# Patient Record
Sex: Male | Born: 1995 | Race: White | Hispanic: No | Marital: Single
Health system: Southern US, Community
[De-identification: ages and names within clinical notes are randomized; demographics above are authoritative.]

## PROBLEM LIST (undated history)

## (undated) DIAGNOSIS — J45909 Unspecified asthma, uncomplicated: Secondary | ICD-10-CM

---
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
## 2018-10-09 ENCOUNTER — Emergency Department
Admission: EM | Admit: 2018-10-09 | Discharge: 2018-10-09 | Disposition: A | Discharge status: Home / Self Care | Payer: Commercial Managed Care - PPO | Attending: Emergency Medicine | Admitting: Emergency Medicine

## 2018-10-09 ENCOUNTER — Other Ambulatory Visit: Payer: Self-pay

## 2018-10-09 DIAGNOSIS — Y939 Activity, unspecified: Secondary | ICD-10-CM | POA: Diagnosis not present

## 2018-10-09 DIAGNOSIS — B349 Viral infection, unspecified: Secondary | ICD-10-CM | POA: Insufficient documentation

## 2018-10-09 DIAGNOSIS — Y999 Unspecified external cause status: Secondary | ICD-10-CM | POA: Insufficient documentation

## 2018-10-09 DIAGNOSIS — S098XXA Other specified injuries of head, initial encounter: Secondary | ICD-10-CM | POA: Diagnosis not present

## 2018-10-09 DIAGNOSIS — W19XXXA Unspecified fall, initial encounter: Secondary | ICD-10-CM | POA: Insufficient documentation

## 2018-10-09 DIAGNOSIS — S0990XA Unspecified injury of head, initial encounter: Secondary | ICD-10-CM

## 2018-10-09 DIAGNOSIS — Y929 Unspecified place or not applicable: Secondary | ICD-10-CM | POA: Insufficient documentation

## 2018-10-09 LAB — BASIC METABOLIC PANEL
Anion gap: 9 (ref 5–15)
BUN: 16 mg/dL (ref 6–20)
CALCIUM: 8.6 mg/dL — AB (ref 8.9–10.3)
CO2: 24 mmol/L (ref 22–32)
Chloride: 101 mmol/L (ref 98–111)
Creatinine, Ser: 1.05 mg/dL (ref 0.61–1.24)
GFR calc Af Amer: >60 mL/min (ref >60)
GFR calc non Af Amer: >60 mL/min (ref >60)
Glucose, Bld: 142 mg/dL — ABNORMAL HIGH (ref 70–99)
Potassium: 4.1 mmol/L (ref 3.5–5.1)
SODIUM: 134 mmol/L — AB (ref 135–145)

## 2018-10-09 LAB — CBC
HCT: 49.7 % (ref 39.0–52.0)
Hemoglobin: 16.3 g/dL (ref 13.0–17.0)
MCH: 31.3 pg (ref 26.0–34.0)
MCHC: 32.8 g/dL (ref 30.0–36.0)
MCV: 95.6 fL (ref 80.0–100.0)
Platelets: 179 10*3/uL (ref 150–400)
RBC: 5.2 MIL/uL (ref 4.22–5.81)
RDW: 12.2 % (ref 11.5–15.5)
WBC: 7 10*3/uL (ref 4.0–10.5)
nRBC: 0 % (ref 0.0–0.2)

## 2018-10-09 NOTE — Discharge Instructions (Addendum)
Please seek medical attention for any high fevers, chest pain, shortness of breath, change in behavior, persistent vomiting, bloody stool or any other new or concerning symptoms.  

## 2018-10-09 NOTE — ED Triage Notes (Addendum)
Pt states fever that started couple of days ago. Pt states he thinks he may have the Flu.  Pt states he became disoriented and collapsed on the floor last night and hit his head.  Pt unsure if he LOC. Pt concerned since he hit his head. Pt unsure of what happened.  Pt states pain in head. Pt is alert and oriented X4

## 2018-10-09 NOTE — ED Triage Notes (Signed)
First Nurse NOte:  C/O fever and body aches x 2 days.  Last medicated for fever yesterday.  Patient states last night when he got up out of bed, felt disoriented and hit head on the corner of a wall.  Possible episode of passing out after injury.    Patient is AAOx3.  Skin warm and dry.  NAD

## 2018-10-09 NOTE — ED Provider Notes (Signed)
[facility] Emergency Department Provider Note   ____________________________________________   I have reviewed the triage vital signs and the nursing notes.   HISTORY  Chief Complaint Concern for head injury  History limited by: Not Limited   HPI Albert Weiss is a 22 y.o. male who presents to the emergency department today because of concern for head injury. The patient states that for the last few days he has been having flu like symptoms. Last night when he got up to get something to drink he felt like he got off balance and fell hitting his head. He thinks that he might have lost consciousness at some point. Since then he has had some on and off pain over his right eye. The patient has not had any nausea or vomiting today. No change in vision.   History reviewed. No pertinent past medical history.  There are no active problems to display for this patient.   History reviewed. No pertinent surgical history.  Prior to Admission medications   Not on File    Allergies Patient has no allergy information on record.  No family history on file.  Social History Social History   Tobacco Use  . Smoking status: Not on file  Substance Use Topics  . Alcohol use: Not on file  . Drug use: Not on file    Review of Systems Constitutional: Positive for fevers. Eyes: No visual changes. ENT: No sore throat. Cardiovascular: Denies chest pain. Respiratory: Denies shortness of breath. Gastrointestinal: No abdominal pain.  No nausea, no vomiting. Genitourinary: Negative for dysuria. Musculoskeletal: Negative for back pain. Skin: Positive for laceration to left eyebrow Neurological: Negative for headache.  ____________________________________________   PHYSICAL EXAM:  VITAL SIGNS: ED Triage Vitals  Enc Vitals Group     BP 10/09/18 1340 109/64     Pulse Rate 10/09/18 1340 100     Resp 10/09/18 1340 16     Temp 10/09/18 1340 99.2 F (37.3 C)      Temp Source 10/09/18 1340 Oral     SpO2 10/09/18 1340 100 %     Weight 10/09/18 1355 115 lb (52.2 kg)     Height 10/09/18 1355 5\' 4"  (1.626 m)     Head Circumference --      Peak Flow --      Pain Score 10/09/18 1355 5   Constitutional: Alert and oriented.  Eyes: Conjunctivae are normal.  ENT      Head: Normocephalic. Small laceration to right eyebrow.      Nose: No congestion/rhinnorhea.      Mouth/Throat: Mucous membranes are moist.      Neck: No stridor. Hematological/Lymphatic/Immunilogical: No cervical lymphadenopathy. Cardiovascular: Normal rate, regular rhythm.  No murmurs, rubs, or gallops.  Respiratory: Normal respiratory effort without tachypnea nor retractions. Breath sounds are clear and equal bilaterally. No wheezes/rales/rhonchi. Gastrointestinal: Soft and non tender. No rebound. No guarding.  Genitourinary: Deferred Musculoskeletal: Normal range of motion in all extremities. No lower extremity edema. Neurologic:  Normal speech and language. No gross focal neurologic deficits are appreciated.  Skin:  Skin is warm, dry and intact. No rash noted. Psychiatric: Mood and affect are normal. Speech and behavior are normal. Patient exhibits appropriate insight and judgment.  ____________________________________________    LABS (pertinent positives/negatives)  CBC wbc 7.0, hgb 16.3, plt 179 BMP na 134, k 4.1, glu 142, cr 1.05  ____________________________________________   EKG  I, Phineas SemenGraydon Susumu Hackler, attending physician, personally viewed and interpreted this EKG  EKG Time:  1404 Rate: 109 Rhythm: sinus tachycardia Axis: normal Intervals: qtc 404 QRS: narrow ST changes: no st elevation Impression: abnormal ekg   ____________________________________________    RADIOLOGY  None  ____________________________________________   PROCEDURES  Procedures  ____________________________________________   INITIAL IMPRESSION / ASSESSMENT AND PLAN / ED  COURSE  Pertinent labs & imaging results that were available during my care of the patient were reviewed by me and considered in my medical decision making (see chart for details).   Patient presented to the emergency department today because of concerns for head injury.  Injury occurred last night.  At this point patient without any significant nausea or vomiting.  No focal weakness.  Patient without any blurry vision.  At this point I doubt significant intracranial bleed or traumatic injury.  I discussed this with the patient.  Did discuss possible concussion.  Patient felt comfortable deferring imaging.  ____________________________________________   FINAL CLINICAL IMPRESSION(S) / ED DIAGNOSES  Final diagnoses:  Viral illness  Injury of head, initial encounter     Note: This dictation was prepared with Dragon dictation. Any transcriptional errors that result from this process are unintentional     Phineas SemenGoodman, Audry Kauzlarich, MD 10/09/18 1737

## 2018-10-09 NOTE — ED Notes (Signed)
Pt alert and oriented X4, active, cooperative, pt in NAD. RR even and unlabored, color WNL.  Pt informed to return if any life threatening symptoms occur.  Discharge and followup instructions reviewed. ambulates safely. 

## 2020-12-20 DIAGNOSIS — J301 Allergic rhinitis due to pollen: Secondary | ICD-10-CM | POA: Diagnosis not present

## 2021-04-07 DIAGNOSIS — M79641 Pain in right hand: Secondary | ICD-10-CM | POA: Diagnosis not present

## 2021-04-07 DIAGNOSIS — S0502XA Injury of conjunctiva and corneal abrasion without foreign body, left eye, initial encounter: Secondary | ICD-10-CM | POA: Diagnosis not present

## 2021-04-07 DIAGNOSIS — S0500XA Injury of conjunctiva and corneal abrasion without foreign body, unspecified eye, initial encounter: Secondary | ICD-10-CM | POA: Diagnosis not present

## 2021-04-07 DIAGNOSIS — Z23 Encounter for immunization: Secondary | ICD-10-CM | POA: Diagnosis not present

## 2021-04-07 DIAGNOSIS — R079 Chest pain, unspecified: Secondary | ICD-10-CM | POA: Diagnosis not present

## 2021-04-07 DIAGNOSIS — S01111A Laceration without foreign body of right eyelid and periocular area, initial encounter: Secondary | ICD-10-CM | POA: Diagnosis not present

## 2021-04-15 DIAGNOSIS — Z20822 Contact with and (suspected) exposure to covid-19: Secondary | ICD-10-CM | POA: Diagnosis not present

## 2021-04-15 DIAGNOSIS — H1033 Unspecified acute conjunctivitis, bilateral: Secondary | ICD-10-CM | POA: Diagnosis not present

## 2021-04-15 DIAGNOSIS — J069 Acute upper respiratory infection, unspecified: Secondary | ICD-10-CM | POA: Diagnosis not present

## 2021-04-15 DIAGNOSIS — J4521 Mild intermittent asthma with (acute) exacerbation: Secondary | ICD-10-CM | POA: Diagnosis not present

## 2021-05-01 DIAGNOSIS — S01111S Laceration without foreign body of right eyelid and periocular area, sequela: Secondary | ICD-10-CM | POA: Diagnosis not present

## 2021-05-01 DIAGNOSIS — Z4802 Encounter for removal of sutures: Secondary | ICD-10-CM | POA: Diagnosis not present

## 2022-04-09 DIAGNOSIS — Z113 Encounter for screening for infections with a predominantly sexual mode of transmission: Secondary | ICD-10-CM | POA: Diagnosis not present

## 2022-04-09 DIAGNOSIS — Z1159 Encounter for screening for other viral diseases: Secondary | ICD-10-CM | POA: Diagnosis not present

## 2022-04-09 DIAGNOSIS — Z1389 Encounter for screening for other disorder: Secondary | ICD-10-CM | POA: Diagnosis not present

## 2022-04-09 DIAGNOSIS — Z0000 Encounter for general adult medical examination without abnormal findings: Secondary | ICD-10-CM | POA: Diagnosis not present

## 2022-05-29 DIAGNOSIS — J4520 Mild intermittent asthma, uncomplicated: Secondary | ICD-10-CM | POA: Diagnosis not present

## 2022-05-29 DIAGNOSIS — Z23 Encounter for immunization: Secondary | ICD-10-CM | POA: Diagnosis not present

## 2022-05-29 DIAGNOSIS — Z1389 Encounter for screening for other disorder: Secondary | ICD-10-CM | POA: Diagnosis not present

## 2022-05-29 DIAGNOSIS — Z0000 Encounter for general adult medical examination without abnormal findings: Secondary | ICD-10-CM | POA: Diagnosis not present

## 2022-09-03 DIAGNOSIS — H6121 Impacted cerumen, right ear: Secondary | ICD-10-CM | POA: Diagnosis not present

## 2022-09-03 DIAGNOSIS — J329 Chronic sinusitis, unspecified: Secondary | ICD-10-CM | POA: Diagnosis not present

## 2022-09-03 DIAGNOSIS — R059 Cough, unspecified: Secondary | ICD-10-CM | POA: Diagnosis not present

## 2022-09-09 DIAGNOSIS — J4520 Mild intermittent asthma, uncomplicated: Secondary | ICD-10-CM | POA: Diagnosis not present

## 2022-09-09 DIAGNOSIS — Z1389 Encounter for screening for other disorder: Secondary | ICD-10-CM | POA: Diagnosis not present

## 2022-09-09 DIAGNOSIS — Z0000 Encounter for general adult medical examination without abnormal findings: Secondary | ICD-10-CM | POA: Diagnosis not present

## 2023-04-03 ENCOUNTER — Other Ambulatory Visit (HOSPITAL_COMMUNITY): Payer: Self-pay

## 2023-04-03 MED ORDER — ALBUTEROL SULFATE HFA 108 (90 BASE) MCG/ACT IN AERS
2.0000 | INHALATION_SPRAY | Freq: Two times a day (BID) | RESPIRATORY_TRACT | 0 refills | Status: AC | PRN
  Filled 2023-04-03: qty 6.7, 50d supply, fill #0

## 2023-04-03 MED ORDER — BECLOMETHASONE DIPROP HFA 80 MCG/ACT IN AERB
4.0000 | INHALATION_SPRAY | Freq: Two times a day (BID) | RESPIRATORY_TRACT | 0 refills | Status: AC
  Filled 2023-04-03: qty 10.6, 15d supply, fill #0

## 2023-04-06 ENCOUNTER — Other Ambulatory Visit (HOSPITAL_COMMUNITY): Payer: Self-pay

## 2023-04-13 ENCOUNTER — Other Ambulatory Visit (HOSPITAL_COMMUNITY): Payer: Self-pay

## 2023-05-28 DIAGNOSIS — J4520 Mild intermittent asthma, uncomplicated: Secondary | ICD-10-CM | POA: Diagnosis not present

## 2023-05-28 DIAGNOSIS — Z1389 Encounter for screening for other disorder: Secondary | ICD-10-CM | POA: Diagnosis not present

## 2023-06-01 DIAGNOSIS — J4520 Mild intermittent asthma, uncomplicated: Secondary | ICD-10-CM | POA: Diagnosis not present

## 2023-06-01 DIAGNOSIS — Z1389 Encounter for screening for other disorder: Secondary | ICD-10-CM | POA: Diagnosis not present

## 2023-07-21 ENCOUNTER — Ambulatory Visit
Admission: RE | Admit: 2023-07-21 | Discharge: 2023-07-21 | Disposition: A | Discharge status: Home / Self Care | Payer: Commercial Managed Care - PPO | Source: Ambulatory Visit | Attending: Emergency Medicine | Admitting: Emergency Medicine

## 2023-07-21 VITALS — BP 119/86 | HR 80 | Temp 98.5°F | Resp 18

## 2023-07-21 DIAGNOSIS — J0140 Acute pansinusitis, unspecified: Secondary | ICD-10-CM | POA: Diagnosis not present

## 2023-07-21 HISTORY — DX: Unspecified asthma, uncomplicated: J45.909

## 2023-07-21 MED ORDER — AMOXICILLIN-POT CLAVULANATE 875-125 MG PO TABS: 1.0000 | ORAL_TABLET | Freq: Two times a day (BID) | ORAL | 0 refills | Status: AC

## 2023-07-21 NOTE — ED Triage Notes (Signed)
Patient to Urgent Care with complaints of sinus pain and pressure. Mostly left sided. Reports a pulsating pain. Intermittently worsens.  Reports symptoms approx one month ago.  Taking tylenol. Denies any fevers.

## 2023-07-21 NOTE — ED Provider Notes (Signed)
Renaldo Fiddler    CSN: [id number] Arrival date & time: 07/21/23  1507      History   Chief Complaint Chief Complaint  Patient presents with   Nasal Congestion    HPI Albert Weiss is a 27 y.o. male.   Patient presents for evaluation of nasal congestion and left-sided sinus pressure and left-sided intermittent frontal headaches present for 1 month.  Initially started off with a viral illness and symptoms have waxed and waned since.  Has pain and pressure primarily affecting the cheeks and the forehead, making it painful to chew. has been using decongestant and Tylenol which has been somewhat helpful however symptoms have not resolved.  History of asthma, denies shortness of breath or wheezing.  Denies fever.  Past Medical History:  Diagnosis Date   Asthma     There are no problems to display for this patient.   History reviewed. No pertinent surgical history.     Home Medications    Prior to Admission medications   Medication Sig Start Date End Date Taking? Authorizing Provider  amoxicillin-clavulanate (AUGMENTIN) 875-125 MG tablet Take 1 tablet by mouth every 12 (twelve) hours for 10 days. 07/21/23 07/31/23 Yes Shade Rivenbark R, NP  albuterol (VENTOLIN HFA) 108 (90 Base) MCG/ACT inhaler Inhale 2 puffs into the lungs 2 (two) times daily as needed. 04/03/23     beclomethasone (QVAR) 80 MCG/ACT inhaler Inhale 4 puffs into the lungs 2 (two) times daily. 04/03/23       Family History History reviewed. No pertinent family history.  Social History Social History   Tobacco Use   Smoking status: Never   Smokeless tobacco: Never  Vaping Use   Vaping status: Never Used  Substance Use Topics   Alcohol use: Never   Drug use: Never     Allergies   Ivp dye [iodinated contrast media]   Review of Systems Review of Systems   Physical Exam Triage Vital Signs ED Triage Vitals  Encounter Vitals Group     BP 07/21/23 1515 119/86     Systolic BP Percentile --       Diastolic BP Percentile --      Pulse Rate 07/21/23 1515 80     Resp 07/21/23 1515 18     Temp 07/21/23 1515 98.5 F (36.9 C)     Temp src --      SpO2 07/21/23 1515 96 %     Weight --      Height --      Head Circumference --      Peak Flow --      Pain Score 07/21/23 1510 2     Pain Loc --      Pain Education --      Exclude from Growth Chart --    No data found.  Updated Vital Signs BP 119/86   Pulse 80   Temp 98.5 F (36.9 C)   Resp 18   SpO2 96%   Visual Acuity Right Eye Distance:   Left Eye Distance:   Bilateral Distance:    Right Eye Near:   Left Eye Near:    Bilateral Near:     Physical Exam Constitutional:      Appearance: Normal appearance.  HENT:     Head: Normocephalic.     Right Ear: There is impacted cerumen.     Left Ear: Tympanic membrane, ear canal and external ear normal.     Nose: Congestion present. No rhinorrhea.  Left Sinus: Maxillary sinus tenderness and frontal sinus tenderness present.     Mouth/Throat:     Mouth: Mucous membranes are moist.     Pharynx: Oropharynx is clear. No oropharyngeal exudate or posterior oropharyngeal erythema.  Eyes:     Extraocular Movements: Extraocular movements intact.  Cardiovascular:     Rate and Rhythm: Normal rate and regular rhythm.     Pulses: Normal pulses.     Heart sounds: Normal heart sounds.  Pulmonary:     Effort: Pulmonary effort is normal.     Breath sounds: Normal breath sounds.  Neurological:     Mental Status: He is alert and oriented to person, place, and time. Mental status is at baseline.      UC Treatments / Results  Labs (all labs ordered are listed, but only abnormal results are displayed) Labs Reviewed - No data to display  EKG   Radiology No results found.  Procedures Procedures (including critical care time)  Medications Ordered in UC Medications - No data to display  Initial Impression / Assessment and Plan / UC Course  I have reviewed the triage  vital signs and the nursing notes.  Pertinent labs & imaging results that were available during my care of the patient were reviewed by me and considered in my medical decision making (see chart for details).  Acute nonrecurrent pansinusitis  Patient is in no signs of distress nor toxic appearing.  Vital signs are stable.  Low suspicion for pneumonia, pneumothorax or bronchitis and therefore will defer imaging.  Presentation and symptomology consistent with a sinusitis, present for 1 month.  Prescribed Augmentin. May use  over-the-counter medications as needed for supportive care.  May follow-up with urgent care as needed if symptoms persist or worsen.  Final Clinical Impressions(s) / UC Diagnoses   Final diagnoses:  Acute non-recurrent pansinusitis     Discharge Instructions      Today you are being treated for a sinus infection  Begin Augmentin every morning and every evening for 10 days    You can take Tylenol and/or Ibuprofen as needed for fever reduction and pain relief.   For cough: honey 1/2 to 1 teaspoon (you can dilute the honey in water or another fluid).  You can also use guaifenesin and dextromethorphan for cough. You can use a humidifier for chest congestion and cough.  If you don't have a humidifier, you can sit in the bathroom with the hot shower running.      For sore throat: try warm salt water gargles, cepacol lozenges, throat spray, warm tea or water with lemon/honey, popsicles or ice, or OTC cold relief medicine for throat discomfort.   For congestion: take a daily anti-histamine like Zyrtec, Claritin, and a oral decongestant, such as pseudoephedrine.  You can also use Flonase 1-2 sprays in each nostril daily.   It is important to stay hydrated: drink plenty of fluids (water, gatorade/powerade/pedialyte, juices, or teas) to keep your throat moisturized and help further relieve irritation/discomfort.    ED Prescriptions     Medication Sig Dispense Auth. Provider    amoxicillin-clavulanate (AUGMENTIN) 875-125 MG tablet Take 1 tablet by mouth every 12 (twelve) hours for 10 days. 20 tablet Valinda Hoar, NP      PDMP not reviewed this encounter.   Valinda Hoar, NP 07/21/23 1551

## 2023-07-21 NOTE — Discharge Instructions (Signed)
Today you are being treated for a sinus infection  Begin Augmentin every morning and every evening for 10 days    You can take Tylenol and/or Ibuprofen as needed for fever reduction and pain relief.   For cough: honey 1/2 to 1 teaspoon (you can dilute the honey in water or another fluid).  You can also use guaifenesin and dextromethorphan for cough. You can use a humidifier for chest congestion and cough.  If you don't have a humidifier, you can sit in the bathroom with the hot shower running.      For sore throat: try warm salt water gargles, cepacol lozenges, throat spray, warm tea or water with lemon/honey, popsicles or ice, or OTC cold relief medicine for throat discomfort.   For congestion: take a daily anti-histamine like Zyrtec, Claritin, and a oral decongestant, such as pseudoephedrine.  You can also use Flonase 1-2 sprays in each nostril daily.   It is important to stay hydrated: drink plenty of fluids (water, gatorade/powerade/pedialyte, juices, or teas) to keep your throat moisturized and help further relieve irritation/discomfort.

## 2023-12-23 DIAGNOSIS — Z1331 Encounter for screening for depression: Secondary | ICD-10-CM | POA: Diagnosis not present

## 2023-12-23 DIAGNOSIS — Z23 Encounter for immunization: Secondary | ICD-10-CM | POA: Diagnosis not present

## 2023-12-23 DIAGNOSIS — J4520 Mild intermittent asthma, uncomplicated: Secondary | ICD-10-CM | POA: Diagnosis not present

## 2023-12-23 DIAGNOSIS — Z0000 Encounter for general adult medical examination without abnormal findings: Secondary | ICD-10-CM | POA: Diagnosis not present

## 2023-12-23 DIAGNOSIS — Z1389 Encounter for screening for other disorder: Secondary | ICD-10-CM | POA: Diagnosis not present
